# Patient Record
Sex: Male | Born: 1960 | Race: White | Hispanic: No | Marital: Married | Smoking: Former smoker
Health system: Southern US, Community
[De-identification: ages and names within clinical notes are randomized; demographics above are authoritative.]

## PROBLEM LIST (undated history)

## (undated) HISTORY — PX: OTHER SURGICAL HISTORY: SHX169

---

## 2007-11-04 ENCOUNTER — Emergency Department (HOSPITAL_COMMUNITY): Admission: EM | Admit: 2007-11-04 | Discharge: 2007-11-04 | Payer: Self-pay | Admitting: Emergency Medicine

## 2009-02-01 ENCOUNTER — Emergency Department (HOSPITAL_COMMUNITY): Admission: EM | Admit: 2009-02-01 | Discharge: 2009-02-01 | Payer: Self-pay | Admitting: Emergency Medicine

## 2009-04-09 ENCOUNTER — Emergency Department (HOSPITAL_COMMUNITY): Admission: EM | Admit: 2009-04-09 | Discharge: 2009-04-10 | Payer: Self-pay | Admitting: Emergency Medicine

## 2009-07-22 ENCOUNTER — Emergency Department (HOSPITAL_COMMUNITY): Admission: EM | Admit: 2009-07-22 | Discharge: 2009-07-23 | Payer: Self-pay | Admitting: Emergency Medicine

## 2011-01-26 LAB — BASIC METABOLIC PANEL
BUN: 12
CO2: 24
Calcium: 9.1
Chloride: 108
Creatinine, Ser: 1.29
GFR calc Af Amer: >60
Glucose, Bld: 109 — ABNORMAL HIGH
Sodium: 140

## 2011-01-26 LAB — DIFFERENTIAL
Basophils Absolute: 0
Basophils Relative: 0
Eosinophils Absolute: 0.1
Eosinophils Relative: 1
Monocytes Absolute: 0.7
Monocytes Relative: 7
Neutro Abs: 7.2
Neutrophils Relative %: 70

## 2011-01-26 LAB — MAGNESIUM: Magnesium: 2.2

## 2011-01-26 LAB — CBC
MCHC: 34.7
MCV: 89.1
RDW: 12.6
WBC: 10.4

## 2014-07-20 ENCOUNTER — Emergency Department (HOSPITAL_COMMUNITY): Payer: 59

## 2014-07-20 ENCOUNTER — Emergency Department (HOSPITAL_COMMUNITY)
Admission: EM | Admit: 2014-07-20 | Discharge: 2014-07-20 | Disposition: A | Discharge status: Home / Self Care | Payer: 59 | Attending: Emergency Medicine | Admitting: Emergency Medicine

## 2014-07-20 ENCOUNTER — Encounter (HOSPITAL_COMMUNITY): Payer: Self-pay | Admitting: Emergency Medicine

## 2014-07-20 DIAGNOSIS — M549 Dorsalgia, unspecified: Secondary | ICD-10-CM

## 2014-07-20 DIAGNOSIS — Z791 Long term (current) use of non-steroidal anti-inflammatories (NSAID): Secondary | ICD-10-CM | POA: Diagnosis not present

## 2014-07-20 DIAGNOSIS — Y9389 Activity, other specified: Secondary | ICD-10-CM | POA: Insufficient documentation

## 2014-07-20 DIAGNOSIS — S0990XA Unspecified injury of head, initial encounter: Secondary | ICD-10-CM | POA: Diagnosis not present

## 2014-07-20 DIAGNOSIS — Z79899 Other long term (current) drug therapy: Secondary | ICD-10-CM | POA: Insufficient documentation

## 2014-07-20 DIAGNOSIS — M542 Cervicalgia: Secondary | ICD-10-CM

## 2014-07-20 DIAGNOSIS — Y998 Other external cause status: Secondary | ICD-10-CM | POA: Insufficient documentation

## 2014-07-20 DIAGNOSIS — S199XXA Unspecified injury of neck, initial encounter: Secondary | ICD-10-CM | POA: Diagnosis present

## 2014-07-20 DIAGNOSIS — Z87891 Personal history of nicotine dependence: Secondary | ICD-10-CM | POA: Insufficient documentation

## 2014-07-20 DIAGNOSIS — Y92410 Unspecified street and highway as the place of occurrence of the external cause: Secondary | ICD-10-CM | POA: Insufficient documentation

## 2014-07-20 DIAGNOSIS — S3992XA Unspecified injury of lower back, initial encounter: Secondary | ICD-10-CM | POA: Diagnosis not present

## 2014-07-20 MED ORDER — CYCLOBENZAPRINE HCL 10 MG PO TABS: 10.0000 mg | ORAL_TABLET | Freq: Two times a day (BID) | ORAL | Status: AC | PRN

## 2014-07-20 MED ORDER — NAPROXEN 500 MG PO TABS: 500.0000 mg | ORAL_TABLET | Freq: Two times a day (BID) | ORAL | Status: AC

## 2014-07-20 MED ORDER — HYDROMORPHONE HCL 1 MG/ML IJ SOLN
1.0000 mg | Freq: Once | INTRAMUSCULAR | Status: AC
  Administered 2014-07-20: 1 mg via INTRAVENOUS
  Filled 2014-07-20: qty 1
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
## 2017-02-21 ENCOUNTER — Ambulatory Visit: Payer: 59 | Admitting: Audiology

## 2017-05-24 ENCOUNTER — Telehealth: Payer: Self-pay | Admitting: Audiology

## 2017-05-24 NOTE — Telephone Encounter (Signed)
[facility] does not provide hearing aids. If that is primary reason for visit, follow-up with referent for a hearing aid evaluation and audiogram is recommended. Deon Duer L. Tyriana Helmkamp, Au.D., CCC-A Doctor of Audiology 05/24/2017  

## 2017-05-28 ENCOUNTER — Ambulatory Visit: Payer: 59 | Admitting: Audiology

## 2019-10-10 ENCOUNTER — Other Ambulatory Visit: Payer: Self-pay | Admitting: Physician Assistant

## 2019-10-10 DIAGNOSIS — F17200 Nicotine dependence, unspecified, uncomplicated: Secondary | ICD-10-CM

## 2019-10-27 ENCOUNTER — Ambulatory Visit: Payer: 59

## 2019-11-06 ENCOUNTER — Other Ambulatory Visit: Payer: Self-pay | Admitting: Physician Assistant

## 2019-11-07 ENCOUNTER — Ambulatory Visit
Admission: RE | Admit: 2019-11-07 | Discharge: 2019-11-07 | Disposition: A | Discharge status: Home / Self Care | Payer: No Typology Code available for payment source | Source: Ambulatory Visit | Attending: Physician Assistant | Admitting: Physician Assistant

## 2019-11-07 DIAGNOSIS — F17200 Nicotine dependence, unspecified, uncomplicated: Secondary | ICD-10-CM

## 2019-11-13 ENCOUNTER — Telehealth: Payer: Self-pay

## 2019-11-13 NOTE — Telephone Encounter (Signed)
NOTES ON FILE FROM [facility] [phone], SENT REFERRAL TO SCHEDULING °

## 2020-01-23 ENCOUNTER — Encounter: Payer: Self-pay | Admitting: Cardiovascular Disease

## 2020-01-23 ENCOUNTER — Other Ambulatory Visit: Payer: Self-pay

## 2020-01-23 ENCOUNTER — Ambulatory Visit (INDEPENDENT_AMBULATORY_CARE_PROVIDER_SITE_OTHER): Payer: No Typology Code available for payment source | Admitting: Cardiovascular Disease

## 2020-01-23 VITALS — BP 122/74 | HR 48 | Temp 97.0°F | Ht 71.0 in | Wt 172.0 lb

## 2020-01-23 DIAGNOSIS — Z01812 Encounter for preprocedural laboratory examination: Secondary | ICD-10-CM

## 2020-01-23 DIAGNOSIS — I2584 Coronary atherosclerosis due to calcified coronary lesion: Secondary | ICD-10-CM

## 2020-01-23 DIAGNOSIS — I2510 Atherosclerotic heart disease of native coronary artery without angina pectoris: Secondary | ICD-10-CM

## 2020-01-23 DIAGNOSIS — R0600 Dyspnea, unspecified: Secondary | ICD-10-CM

## 2020-01-23 DIAGNOSIS — R0609 Other forms of dyspnea: Secondary | ICD-10-CM | POA: Insufficient documentation

## 2020-01-23 DIAGNOSIS — Z720 Tobacco use: Secondary | ICD-10-CM | POA: Insufficient documentation

## 2020-01-23 DIAGNOSIS — R5383 Other fatigue: Secondary | ICD-10-CM | POA: Diagnosis not present

## 2020-01-23 DIAGNOSIS — I359 Nonrheumatic aortic valve disorder, unspecified: Secondary | ICD-10-CM

## 2020-01-23 DIAGNOSIS — I10 Essential (primary) hypertension: Secondary | ICD-10-CM | POA: Diagnosis not present

## 2020-01-23 DIAGNOSIS — I739 Peripheral vascular disease, unspecified: Secondary | ICD-10-CM | POA: Diagnosis not present

## 2020-01-23 HISTORY — DX: Atherosclerotic heart disease of native coronary artery without angina pectoris: I25.10

## 2020-01-23 HISTORY — DX: Nonrheumatic aortic valve disorder, unspecified: I35.9

## 2020-01-23 HISTORY — DX: Tobacco use: Z72.0

## 2020-01-23 HISTORY — DX: Dyspnea, unspecified: R06.00

## 2020-01-23 HISTORY — DX: Other forms of dyspnea: R06.09

## 2020-01-23 HISTORY — DX: Essential (primary) hypertension: I10
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
## 2020-01-23 NOTE — Patient Instructions (Signed)
Medication Instructions:  Your physician recommends that you continue on your current medications as directed. Please refer to the Current Medication list given to you today.  *If you need a refill on your cardiac medications before your next appointment, please call your pharmacy*  Lab Work: BMET 1 WEEK PRIOR TO CT   If you have labs (blood work) drawn today and your tests are completely normal, you will receive your results only by: Marland Kitchen MyChart Message (if you have MyChart) OR . A paper copy in the mail If you have any lab test that is abnormal or we need to change your treatment, we will call you to review the results.   Testing/Procedures: Your physician has requested that you have cardiac CT. Cardiac computed tomography (CT) is a painless test that uses an x-ray machine to take clear, detailed pictures of your heart. For further information please visit [email or web address]. Please follow instruction sheet as given. ONCE INSURANCE HAS APPROVED THE OFFICE WILL CALL YOU TO SCHEDULE. IF YOU DO NOT HEAR FROM THE OFFICE IN 2 WEEKS CALL TO FOLLOW UP   Your physician has requested that you have an ankle brachial index (ABI). During this test an ultrasound and blood pressure cuff are used to evaluate the arteries that supply the arms and legs with blood. Allow thirty minutes for this exam. There are no restrictions or special instructions.  Follow-Up: At [facility], you and your health needs are our priority.  As part of our continuing mission to provide you with exceptional heart care, we have created designated Provider Care Teams.  These Care Teams include your primary Cardiologist (physician) and Advanced Practice Providers (APPs -  Physician Assistants and Nurse Practitioners) who all work together to provide you with the care you need, when you need it.  We recommend signing up for the patient portal called "MyChart".  Sign up information is provided on this After Visit Summary.  MyChart is  used to connect with patients for Virtual Visits (Telemedicine).  Patients are able to view lab/test results, encounter notes, upcoming appointments, etc.  Non-urgent messages can be sent to your provider as well.   To learn more about what you can do with MyChart, go to [email or web address].    Your next appointment:   2 month(s)  The format for your next appointment:   In Person  Provider:   You may see DR [facility]  or one of the following Advanced Practice Providers on your designated Care Team:    Kerin Ransom, PA-C  [place], [place]  Coletta Memos, [place]  Other Instructions  Your cardiac CT will be scheduled at one of the below locations:   [facility] [address] [place], [place] [address] [phone]  [place] [address] [place], [place] [address] [phone]  If scheduled at [facility], please arrive at the [facility] main entrance of [facility] 30 minutes prior to test start time. Proceed to the [facility] Radiology Department (first floor) to check-in and test prep.  If scheduled at [facility], please arrive 15 mins early for check-in and test prep.  Please follow these instructions carefully (unless otherwise directed):  Hold all erectile dysfunction medications at least 3 days (72 hrs) prior to test.  On the Night Before the Test: . Be sure to Drink plenty of water. . Do not consume any caffeinated/decaffeinated beverages or chocolate 12 hours prior to your test. .  Do not take any antihistamines 12 hours prior to your test. . If the patient has contrast allergy: ? Patient will need a prescription for Prednisone and very clear instructions (as follows): 1. Prednisone 50 mg - take 13 hours prior to test 2. Take another Prednisone 50 mg 7 hours prior to test 3. Take another Prednisone 50 mg 1 hour prior to test 4. Take Benadryl 50  mg 1 hour prior to test . Patient must complete all four doses of above prophylactic medications. . Patient will need a ride after test due to Benadryl.  On the Day of the Test: . Drink plenty of water. Do not drink any water within one hour of the test. . Do not eat any food 4 hours prior to the test. . You may take your regular medications prior to the test.  . Take metoprolol (Lopressor) two hours prior to test. . HOLD Furosemide/Hydrochlorothiazide morning of the test. . FEMALES- please wear underwire-free bra if available      After the Test: . Drink plenty of water. . After receiving IV contrast, you may experience a mild flushed feeling. This is normal. . On occasion, you may experience a mild rash up to 24 hours after the test. This is not dangerous. If this occurs, you can take Benadryl 25 mg and increase your fluid intake. . If you experience trouble breathing, this can be serious. If it is severe call 911 IMMEDIATELY. If it is mild, please call our office. . If you take any of these medications: Glipizide/Metformin, Avandament, Glucavance, please do not take 48 hours after completing test unless otherwise instructed.   Once we have confirmed authorization from your insurance company, we will call you to set up a date and time for your test. Based on how quickly your insurance processes prior authorizations requests, please allow up to 4 weeks to be contacted for scheduling your Cardiac CT appointment. Be advised that routine Cardiac CT appointments could be scheduled as many as 8 weeks after your provider has ordered it.  For non-scheduling related questions, please contact the cardiac imaging nurse navigator should you have any questions/concerns: Marchia Bond, Cardiac Imaging Nurse Navigator Burley Saver, Interim Cardiac Imaging Nurse [place] [facility] Direct Office Dial: [phone]   For scheduling needs, including cancellations and  rescheduling, please call Vivien Rota at [phone], option [phone].      Cardiac CT Angiogram A cardiac CT angiogram is a procedure to look at the heart and the area around the heart. It may be done to help find the cause of chest pains or other symptoms of heart disease. During this procedure, a substance called contrast dye is injected into the blood vessels in the area to be checked. A large X-ray machine, called a CT scanner, then takes detailed pictures of the heart and the surrounding area. The procedure is also sometimes called a coronary CT angiogram, coronary artery scanning, or CTA. A cardiac CT angiogram allows the health care provider to see how well blood is flowing to and from the heart. The health care provider will be able to see if there are any problems, such as:  Blockage or narrowing of the coronary arteries in the heart.  Fluid around the heart.  Signs of weakness or disease in the muscles, valves, and tissues of the heart. Tell a health care provider about:  Any allergies you have. This is especially important if you have had a previous allergic reaction to contrast dye.  All medicines you are taking, including vitamins, herbs, eye drops, creams, and over-the-counter medicines.  Any blood disorders you have.  Any surgeries you have had.  Any medical conditions you have.  Whether you are pregnant or may be pregnant.  Any anxiety disorders, chronic pain, or other conditions you have that may increase your stress or prevent you from lying still. What are the risks? Generally, this is a safe procedure. However, problems may occur, including:  Bleeding.  Infection.  Allergic reactions to medicines or dyes.  Damage to other structures or organs.  Kidney damage from the contrast dye that is used.  Increased risk of cancer from radiation exposure. This risk is low. Talk with your health care provider about: ? The risks and benefits of testing. ? How you can receive the  lowest dose of radiation. What happens before the procedure?  Wear comfortable clothing and remove any jewelry, glasses, dentures, and hearing aids.  Follow instructions from your health care provider about eating and drinking. This may include: ? For 12 hours before the procedure -- avoid caffeine. This includes tea, coffee, soda, energy drinks, and diet pills. Drink plenty of water or other fluids that do not have caffeine in them. Being well hydrated can prevent complications. ? For 4-6 hours before the procedure -- stop eating and drinking. The contrast dye can cause nausea, but this is less likely if your stomach is empty.  Ask your health care provider about changing or stopping your regular medicines. This is especially important if you are taking diabetes medicines, blood thinners, or medicines to treat problems with erections (erectile dysfunction). What happens during the procedure?   Hair on your chest may need to be removed so that small sticky patches called electrodes can be placed on your chest. These will transmit information that helps to monitor your heart during the procedure.  An IV will be inserted into one of your veins.  You might be given a medicine to control your heart rate during the procedure. This will help to ensure that good images are obtained.  You will be asked to lie on an exam table. This table will slide in and out of the CT machine during the procedure.  Contrast dye will be injected into the IV. You might feel warm, or you may get a metallic taste in your mouth.  You will be given a medicine called nitroglycerin. This will relax or dilate the arteries in your heart.  The table that you are lying on will move into the CT machine tunnel for the scan.  The person running the machine will give you instructions while the scans are being done. You may be asked to: ? Keep your arms above your head. ? Hold your breath. ? Stay very still, even if the table  is moving.  When the scanning is complete, you will be moved out of the machine.  The IV will be removed. The procedure may vary among health care providers and hospitals. What can I expect after the procedure? After your procedure, it is common to have:  A metallic taste in your mouth from the contrast dye.  A feeling of warmth.  A headache from the nitroglycerin. Follow these instructions at home:  Take over-the-counter and prescription medicines only as told by your health care provider.  If you are told, drink enough fluid to keep your urine pale yellow. This will help to flush the contrast dye out of your body.  Most people can  return to their normal activities right after the procedure. Ask your health care provider what activities are safe for you.  It is up to you to get the results of your procedure. Ask your health care provider, or the department that is doing the procedure, when your results will be ready.  Keep all follow-up visits as told by your health care provider. This is important. Contact a health care provider if:  You have any symptoms of allergy to the contrast dye. These include: ? Shortness of breath. ? Rash or hives. ? A racing heartbeat. Summary  A cardiac CT angiogram is a procedure to look at the heart and the area around the heart. It may be done to help find the cause of chest pains or other symptoms of heart disease.  During this procedure, a large X-ray machine, called a CT scanner, takes detailed pictures of the heart and the surrounding area after a contrast dye has been injected into blood vessels in the area.  Ask your health care provider about changing or stopping your regular medicines before the procedure. This is especially important if you are taking diabetes medicines, blood thinners, or medicines to treat erectile dysfunction.  If you are told, drink enough fluid to keep your urine pale yellow. This will help to flush the contrast  dye out of your body. This information is not intended to replace advice given to you by your health care provider. Make sure you discuss any questions you have with your health care provider. Document Revised: 12/11/2018 Document Reviewed: 12/11/2018 Elsevier Patient Education  [employer].

## 2020-02-05 LAB — BASIC METABOLIC PANEL
BUN/Creatinine Ratio: 11 (ref 9–20)
BUN: 15 mg/dL (ref 6–24)
CO2: 27 mmol/L (ref 20–29)
Calcium: 9.3 mg/dL (ref 8.7–10.2)
Chloride: 98 mmol/L (ref 96–106)
Creatinine, Ser: 1.38 mg/dL — ABNORMAL HIGH (ref 0.76–1.27)
GFR calc Af Amer: 65 mL/min/{1.73_m2} (ref >59)
GFR calc non Af Amer: 56 mL/min/{1.73_m2} — ABNORMAL LOW (ref >59)
Glucose: 83 mg/dL (ref 65–99)
Potassium: 4.8 mmol/L (ref 3.5–5.2)
Sodium: 136 mmol/L (ref 134–144)

## 2020-02-06 ENCOUNTER — Telehealth (HOSPITAL_COMMUNITY): Payer: Self-pay | Admitting: Emergency Medicine

## 2020-02-06 NOTE — Telephone Encounter (Signed)
Reaching out to patient to offer assistance regarding upcoming cardiac imaging study; pt verbalizes understanding of appt date/time, parking situation and where to check in, pre-test NPO status and medications ordered, and verified current allergies; name and call back number provided for further questions should they arise Sulo Janczak RN Navigator Cardiac Imaging [facility] [phone] office [phone] cell 

## 2020-02-09 ENCOUNTER — Ambulatory Visit (HOSPITAL_COMMUNITY)
Admission: RE | Admit: 2020-02-09 | Discharge: 2020-02-09 | Disposition: A | Discharge status: Home / Self Care | Payer: No Typology Code available for payment source | Source: Ambulatory Visit | Attending: Cardiovascular Disease | Admitting: Cardiovascular Disease

## 2020-02-09 ENCOUNTER — Other Ambulatory Visit: Payer: Self-pay

## 2020-02-09 DIAGNOSIS — I10 Essential (primary) hypertension: Secondary | ICD-10-CM | POA: Diagnosis present

## 2020-02-09 DIAGNOSIS — R0600 Dyspnea, unspecified: Secondary | ICD-10-CM | POA: Diagnosis not present

## 2020-02-09 DIAGNOSIS — R5383 Other fatigue: Secondary | ICD-10-CM | POA: Insufficient documentation

## 2020-02-09 DIAGNOSIS — R0609 Other forms of dyspnea: Secondary | ICD-10-CM

## 2020-02-09 DIAGNOSIS — I2510 Atherosclerotic heart disease of native coronary artery without angina pectoris: Secondary | ICD-10-CM

## 2020-02-09 MED ORDER — NITROGLYCERIN 0.4 MG SL SUBL
SUBLINGUAL_TABLET | SUBLINGUAL | Status: AC
  Filled 2020-02-09: qty 2

## 2020-02-09 MED ORDER — NITROGLYCERIN 0.4 MG SL SUBL
0.8000 mg | SUBLINGUAL_TABLET | Freq: Once | SUBLINGUAL | Status: AC
  Administered 2020-02-09: 0.8 mg via SUBLINGUAL

## 2020-02-09 MED ORDER — IOHEXOL 350 MG/ML SOLN
80.0000 mL | Freq: Once | INTRAVENOUS | Status: AC | PRN
  Administered 2020-02-09: 80 mL via INTRAVENOUS

## 2020-02-10 ENCOUNTER — Ambulatory Visit (HOSPITAL_COMMUNITY)
Admission: RE | Admit: 2020-02-10 | Discharge: 2020-02-10 | Disposition: A | Discharge status: Home / Self Care | Payer: No Typology Code available for payment source | Source: Ambulatory Visit | Attending: Cardiovascular Disease | Admitting: Cardiovascular Disease

## 2020-02-10 DIAGNOSIS — I10 Essential (primary) hypertension: Secondary | ICD-10-CM | POA: Insufficient documentation

## 2020-02-10 DIAGNOSIS — R0600 Dyspnea, unspecified: Secondary | ICD-10-CM | POA: Diagnosis not present

## 2020-02-10 DIAGNOSIS — R5383 Other fatigue: Secondary | ICD-10-CM | POA: Diagnosis present

## 2020-02-10 DIAGNOSIS — R0609 Other forms of dyspnea: Secondary | ICD-10-CM

## 2020-02-12 DIAGNOSIS — I2510 Atherosclerotic heart disease of native coronary artery without angina pectoris: Secondary | ICD-10-CM | POA: Diagnosis not present

## 2020-02-13 ENCOUNTER — Telehealth: Payer: Self-pay | Admitting: Cardiovascular Disease

## 2020-02-13 DIAGNOSIS — I359 Nonrheumatic aortic valve disorder, unspecified: Secondary | ICD-10-CM

## 2020-02-13 DIAGNOSIS — I2510 Atherosclerotic heart disease of native coronary artery without angina pectoris: Secondary | ICD-10-CM

## 2020-02-13 NOTE — Telephone Encounter (Signed)
Advised patient, verbalized understanding. Scheduled echo and labs same day

## 2020-02-13 NOTE — Telephone Encounter (Signed)
Patient returning call for lab results. 

## 2020-02-13 NOTE — Telephone Encounter (Signed)
Patient called in to office to obtain lab results. Patient notified of the following.    Chilton Si, MD  02/12/2020 3:25 PM EDT     Kidney function mildly abnormal. Make sure to stay hydrated and limit use of naproxen and ibuprofen.    Patient made aware and verbalized understanding.

## 2020-02-13 NOTE — Telephone Encounter (Signed)
-----   Message from Chilton Si, MD sent at 02/12/2020  3:27 PM EDT ----- CT showed mild to moderate blockages that aren't significant enough to cause symptoms but we need to be aggressive about risk factor modification to prevent it from getting worse.  We need to check fasting lipids.  Also recommend getting an echocardiogram because the aortic valve is calcified and may be abnormal.  An echo will help [place] to see this better and determine the significance.  Please make sure he has follow up after the echo to discuss further.

## 2020-02-17 ENCOUNTER — Ambulatory Visit (HOSPITAL_COMMUNITY)
Admission: RE | Admit: 2020-02-17 | Discharge: 2020-02-17 | Disposition: A | Discharge status: Home / Self Care | Payer: No Typology Code available for payment source | Source: Ambulatory Visit | Attending: Cardiovascular Disease | Admitting: Cardiovascular Disease

## 2020-02-17 ENCOUNTER — Other Ambulatory Visit: Payer: Self-pay

## 2020-02-17 DIAGNOSIS — I739 Peripheral vascular disease, unspecified: Secondary | ICD-10-CM | POA: Diagnosis not present

## 2020-02-26 ENCOUNTER — Ambulatory Visit (HOSPITAL_COMMUNITY): Payer: No Typology Code available for payment source | Attending: Cardiovascular Disease

## 2020-02-26 ENCOUNTER — Other Ambulatory Visit: Payer: No Typology Code available for payment source | Admitting: *Deleted

## 2020-02-26 ENCOUNTER — Other Ambulatory Visit: Payer: Self-pay

## 2020-02-26 DIAGNOSIS — I359 Nonrheumatic aortic valve disorder, unspecified: Secondary | ICD-10-CM | POA: Diagnosis present

## 2020-02-26 DIAGNOSIS — I2510 Atherosclerotic heart disease of native coronary artery without angina pectoris: Secondary | ICD-10-CM

## 2020-02-26 LAB — ECHOCARDIOGRAM COMPLETE
AR max vel: 1.8 cm2
AV Area VTI: 1.62 cm2
AV Area mean vel: 1.83 cm2
AV Mean grad: 9 mmHg
AV Peak grad: 18.5 mmHg
Ao pk vel: 2.15 m/s
Area-P 1/2: 3.12 cm2
P 1/2 time: 497 msec
S' Lateral: 3.1 cm

## 2020-02-26 LAB — LIPID PANEL
Chol/HDL Ratio: 3.5 ratio (ref 0.0–5.0)
Cholesterol, Total: 166 mg/dL (ref 100–199)
HDL: 47 mg/dL (ref >39)
LDL Chol Calc (NIH): 97 mg/dL (ref 0–99)
Triglycerides: 122 mg/dL (ref 0–149)
VLDL Cholesterol Cal: 22 mg/dL (ref 5–40)
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
## 2020-03-11 ENCOUNTER — Ambulatory Visit (INDEPENDENT_AMBULATORY_CARE_PROVIDER_SITE_OTHER): Payer: No Typology Code available for payment source | Admitting: General Practice

## 2020-03-11 ENCOUNTER — Encounter: Payer: Self-pay | Admitting: General Practice

## 2020-03-11 ENCOUNTER — Other Ambulatory Visit: Payer: Self-pay

## 2020-03-11 VITALS — BP 134/78 | HR 58 | Ht 72.0 in | Wt 181.6 lb

## 2020-03-11 DIAGNOSIS — E782 Mixed hyperlipidemia: Secondary | ICD-10-CM

## 2020-03-11 DIAGNOSIS — R001 Bradycardia, unspecified: Secondary | ICD-10-CM

## 2020-03-11 DIAGNOSIS — Z79899 Other long term (current) drug therapy: Secondary | ICD-10-CM

## 2020-03-11 DIAGNOSIS — I2510 Atherosclerotic heart disease of native coronary artery without angina pectoris: Secondary | ICD-10-CM | POA: Diagnosis not present

## 2020-03-11 DIAGNOSIS — R0609 Other forms of dyspnea: Secondary | ICD-10-CM

## 2020-03-11 DIAGNOSIS — Z720 Tobacco use: Secondary | ICD-10-CM

## 2020-03-11 DIAGNOSIS — R0600 Dyspnea, unspecified: Secondary | ICD-10-CM

## 2020-03-11 NOTE — Patient Instructions (Addendum)
Medication Instructions:  The current medical regimen is effective;  continue present plan and medications as directed. Please refer to the Current Medication list given to you today. *If you need a refill on your cardiac medications before your next appointment, please call your pharmacy*  Lab Work: FASTING LIPID THE WEEK BEFORE FOLLOW UP APPOINTMENT If you have labs (blood work) drawn today and your tests are completely normal, you will receive your results only by:  MyChart Message (if you have MyChart) OR A paper copy in the mail.  If you have any lab test that is abnormal or we need to change your treatment, we will call you to review the results. You may go to any [facility] that is convenient for you however, we do have a lab in our office that is able to assist you. You DO NOT need an appointment for our lab. The lab is open 8:00am and closes at 4:00pm. Lunch 12:45 - 1:45pm.  Special Instructions PLEASE READ AND FOLLOW INCREASE FIBER DIET  MAKE SURE TO READ LABELS FOR LOWER CHOLESTEROL/FAT  PLEASE INCREASE PHYSICAL ACTIVITY AS TOLERATED  Follow-Up: Your next appointment:  3 month(s) In Person with Chilton Siiffany Ringwood, MD OR IF UNAVAILABLE JESSE CLEAVER, FNP-C  At [facility], you and your health needs are our priority.  As part of our continuing mission to provide you with exceptional heart care, we have created designated Provider Care Teams.  These Care Teams include your primary Cardiologist (physician) and Advanced Practice Providers (APPs -  Physician Assistants and Nurse Practitioners) who all work together to provide you with the care you need, when you need it.  We recommend signing up for the patient portal called "MyChart".  Sign up information is provided on this After Visit Summary.  MyChart is used to connect with patients for Virtual Visits (Telemedicine).  Patients are able to view lab/test results, encounter notes, upcoming appointments, etc.  Non-urgent messages can be sent  to your provider as well.   To learn more about what you can do with MyChart, go to [email or web address].      High-Fiber Diet Fiber, also called dietary fiber, is a type of carbohydrate that is found in fruits, vegetables, whole grains, and beans. A high-fiber diet can have many health benefits. Your health care provider may recommend a high-fiber diet to help:  Prevent constipation. Fiber can make your bowel movements more regular.  Lower your cholesterol.  Relieve the following conditions: ? Swelling of veins in the anus (hemorrhoids). ? Swelling and irritation (inflammation) of specific areas of the digestive tract (uncomplicated diverticulosis). ? A problem of the large intestine (colon) that sometimes causes pain and diarrhea (irritable bowel syndrome, IBS).  Prevent overeating as part of a weight-loss plan.  Prevent heart disease, type 2 diabetes, and certain cancers. What is my plan? The recommended daily fiber intake in grams (g) includes:  38 g for men age 58 or younger.  30 g for men over age 58.  25 g for women age 650 or younger.  21 g for women over age 58. You can get the recommended daily intake of dietary fiber by:  Eating a variety of fruits, vegetables, grains, and beans.  Taking a fiber supplement, if it is not possible to get enough fiber through your diet. What do I need to know about a high-fiber diet?  It is better to get fiber through food sources rather than from fiber supplements. There is not a lot of research about how effective supplements  are.  Always check the fiber content on the nutrition facts label of any prepackaged food. Look for foods that contain 5 g of fiber or more per serving.  Talk with a diet and nutrition specialist (dietitian) if you have questions about specific foods that are recommended or not recommended for your medical condition, especially if those foods are not listed below.  Gradually increase how much fiber you  consume. If you increase your intake of dietary fiber too quickly, you may have bloating, cramping, or gas.  Drink plenty of water. Water helps you to digest fiber. What are tips for following this plan?  Eat a wide variety of high-fiber foods.  Make sure that half of the grains that you eat each day are whole grains.  Eat breads and cereals that are made with whole-grain flour instead of refined flour or white flour.  Eat Romano rice, bulgur wheat, or millet instead of white rice.  Start the day with a breakfast that is high in fiber, such as a cereal that contains 5 g of fiber or more per serving.  Use beans in place of meat in soups, salads, and pasta dishes.  Eat high-fiber snacks, such as berries, raw vegetables, nuts, and popcorn.  Choose whole fruits and vegetables instead of processed forms like juice or sauce. What foods can I eat?  Fruits Berries. Pears. Apples. Oranges. Avocado. Prunes and raisins. Dried figs. Vegetables Sweet potatoes. Spinach. Kale. Artichokes. Cabbage. Broccoli. Cauliflower. Green peas. Carrots. Squash. Grains Whole-grain breads. Multigrain cereal. Oats and oatmeal. Nole rice. Barley. Bulgur wheat. Millet. Quinoa. Bran muffins. Popcorn. Rye wafer crackers. Meats and other proteins Navy, kidney, and pinto beans. Soybeans. Split peas. Lentils. Nuts and seeds. Dairy Fiber-fortified yogurt. Beverages Fiber-fortified soy milk. Fiber-fortified orange juice. Other foods Fiber bars. The items listed above may not be a complete list of recommended foods and beverages. Contact a dietitian for more options. What foods are not recommended? Fruits Fruit juice. Cooked, strained fruit. Vegetables Fried potatoes. Canned vegetables. Well-cooked vegetables. Grains White bread. Pasta made with refined flour. White rice. Meats and other proteins Fatty cuts of meat. Fried chicken or fried fish. Dairy Milk. Yogurt. Cream cheese. Sour cream. Fats and  oils Butters. Beverages Soft drinks. Other foods Cakes and pastries. The items listed above may not be a complete list of foods and beverages to avoid. Contact a dietitian for more information. Summary  Fiber is a type of carbohydrate. It is found in fruits, vegetables, whole grains, and beans.  There are many health benefits of eating a high-fiber diet, such as preventing constipation, lowering blood cholesterol, helping with weight loss, and reducing your risk of heart disease, diabetes, and certain cancers.  Gradually increase your intake of fiber. Increasing too fast can result in cramping, bloating, and gas. Drink plenty of water while you increase your fiber.  The best sources of fiber include whole fruits and vegetables, whole grains, nuts, seeds, and beans. This information is not intended to replace advice given to you by your health care provider. Make sure you discuss any questions you have with your health care provider.  Steps to Quit Smoking Smoking tobacco is the leading cause of preventable death. It can affect almost every organ in the body. Smoking puts you and people around you at risk for many serious, long-lasting (chronic) diseases. Quitting smoking can be hard, but it is one of the best things that you can do for your health. It is never too late to quit. How  do I get ready to quit? When you decide to quit smoking, make a plan to help you succeed. Before you quit:  Pick a date to quit. Set a date within the next 2 weeks to give you time to prepare.  Write down the reasons why you are quitting. Keep this list in places where you will see it often.  Tell your family, friends, and co-workers that you are quitting. Their support is important.  Talk with your doctor about the choices that may help you quit.  Find out if your health insurance will pay for these treatments.  Know the people, places, things, and activities that make you want to smoke (triggers). Avoid  them. What first steps can I take to quit smoking?  Throw away all cigarettes at home, at work, and in your car.  Throw away the things that you use when you smoke, such as ashtrays and lighters.  Clean your car. Make sure to empty the ashtray.  Clean your home, including curtains and carpets. What can I do to help me quit smoking? Talk with your doctor about taking medicines and seeing a counselor at the same time. You are more likely to succeed when you do both.  If you are pregnant or breastfeeding, talk with your doctor about counseling or other ways to quit smoking. Do not take medicine to help you quit smoking unless your doctor tells you to do so. To quit smoking: Quit right away  Quit smoking totally, instead of slowly cutting back on how much you smoke over a period of time.  Go to counseling. You are more likely to quit if you go to counseling sessions regularly. Take medicine You may take medicines to help you quit. Some medicines need a prescription, and some you can buy over-the-counter. Some medicines may contain a drug called nicotine to replace the nicotine in cigarettes. Medicines may:  Help you to stop having the desire to smoke (cravings).  Help to stop the problems that come when you stop smoking (withdrawal symptoms). Your doctor may ask you to use:  Nicotine patches, gum, or lozenges.  Nicotine inhalers or sprays.  Non-nicotine medicine that is taken by mouth. Find resources Find resources and other ways to help you quit smoking and remain smoke-free after you quit. These resources are most helpful when you use them often. They include:  Online chats with a Veterinary surgeon.  Phone quitlines.  Printed Materials engineer.  Support groups or group counseling.  Text messaging programs.  Mobile phone apps. Use apps on your mobile phone or tablet that can help you stick to your quit plan. There are many free apps for mobile phones and tablets as well as  websites. Examples include Quit Guide from the [employer] and smokefree.gov  What things can I do to make it easier to quit?   Talk to your family and friends. Ask them to support and encourage you.  Call a phone quitline (1-800-QUIT-NOW), reach out to support groups, or work with a Veterinary surgeon.  Ask people who smoke to not smoke around you.  Avoid places that make you want to smoke, such as: ? Bars. ? Parties. ? Smoke-break areas at work.  Spend time with people who do not smoke.  Lower the stress in your life. Stress can make you want to smoke. Try these things to help your stress: ? Getting regular exercise. ? Doing deep-breathing exercises. ? Doing yoga. ? Meditating. ? Doing a body scan. To do this, close your  eyes, focus on one area of your body at a time from head to toe. Notice which parts of your body are tense. Try to relax the muscles in those areas. How will I feel when I quit smoking? Day 1 to 3 weeks Within the first 24 hours, you may start to have some problems that come from quitting tobacco. These problems are very bad 2-3 days after you quit, but they do not often last for more than 2-3 weeks. You may get these symptoms:  Mood swings.  Feeling restless, nervous, angry, or annoyed.  Trouble concentrating.  Dizziness.  Strong desire for high-sugar foods and nicotine.  Weight gain.  Trouble pooping (constipation).  Feeling like you may vomit (nausea).  Coughing or a sore throat.  Changes in how the medicines that you take for other issues work in your body.  Depression.  Trouble sleeping (insomnia). Week 3 and afterward After the first 2-3 weeks of quitting, you may start to notice more positive results, such as:  Better sense of smell and taste.  Less coughing and sore throat.  Slower heart rate.  Lower blood pressure.  Clearer skin.  Better breathing.  Fewer sick days. Quitting smoking can be hard. Do not give up if you fail the first time.  Some people need to try a few times before they succeed. Do your best to stick to your quit plan, and talk with your doctor if you have any questions or concerns. Summary  Smoking tobacco is the leading cause of preventable death. Quitting smoking can be hard, but it is one of the best things that you can do for your health.  When you decide to quit smoking, make a plan to help you succeed.  Quit smoking right away, not slowly over a period of time.  When you start quitting, seek help from your doctor, family, or friends. This information is not intended to replace advice given to you by your health care provider. Make sure you discuss any questions you have with your health care provider. Document Revised: 01/10/2019 Document Reviewed: 07/06/2018 Elsevier Patient Education  2020 [employer].

## 2020-03-22 ENCOUNTER — Telehealth: Payer: Self-pay | Admitting: *Deleted

## 2020-03-22 DIAGNOSIS — Q231 Congenital insufficiency of aortic valve: Secondary | ICD-10-CM

## 2020-03-22 NOTE — Telephone Encounter (Signed)
-----   Message from Chilton Si, MD sent at 03/22/2020  1:05 PM EST ----- Aortic valve is bicuspid.  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  The valve is only mildly stiff but will likely get worse with time.  We will repeat an echo in 1 year.  At this point it should not cause any problems but we need to follow it.  His heart is squeezing well and otherwise looks normal.

## 2020-03-22 NOTE — Telephone Encounter (Signed)
Left message to call back  

## 2020-04-09 NOTE — Addendum Note (Signed)
Addended by: Regis Bill B on: 04/09/2020 02:14 PM   Modules accepted: Orders

## 2020-04-09 NOTE — Telephone Encounter (Signed)
Patient never called back. Printed, highlighted Dr Leonides Sake comments, and mailed to patient
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
## 2020-06-11 ENCOUNTER — Ambulatory Visit: Payer: No Typology Code available for payment source | Admitting: General Practice

## 2021-01-20 ENCOUNTER — Telehealth (HOSPITAL_COMMUNITY): Payer: Self-pay | Admitting: Cardiovascular Disease

## 2021-01-20 NOTE — Telephone Encounter (Signed)
Forward to Dr Duke Salvia nurse -  to make aware

## 2021-01-20 NOTE — Telephone Encounter (Signed)
Just an FYI. We have made several attempts to contact this patient including sending a letter to schedule or reschedule their echocardiogram. We will be removing the patient from the echo WQ.  01/06/21 MAILED LETTER [facility]  01/04/21 [facility] to schedule @ 10:09/[facility]  12/30/20 [facility] to schedule @ 10:18/[facility]  12/07/20 [facility] to schedule in Oct @ 11:19/[facility]     Thank you

## 2021-04-08 ENCOUNTER — Other Ambulatory Visit: Payer: Self-pay | Admitting: Physician Assistant

## 2021-04-08 DIAGNOSIS — F17200 Nicotine dependence, unspecified, uncomplicated: Secondary | ICD-10-CM

## 2021-04-08 DIAGNOSIS — Z122 Encounter for screening for malignant neoplasm of respiratory organs: Secondary | ICD-10-CM

## 2021-05-03 ENCOUNTER — Inpatient Hospital Stay: Admission: RE | Admit: 2021-05-03 | Payer: No Typology Code available for payment source | Source: Ambulatory Visit

## 2021-05-24 ENCOUNTER — Ambulatory Visit
Admission: RE | Admit: 2021-05-24 | Discharge: 2021-05-24 | Disposition: A | Discharge status: Home / Self Care | Payer: No Typology Code available for payment source | Source: Ambulatory Visit | Attending: Physician Assistant | Admitting: Physician Assistant

## 2021-05-24 ENCOUNTER — Other Ambulatory Visit: Payer: Self-pay

## 2021-05-24 DIAGNOSIS — Z122 Encounter for screening for malignant neoplasm of respiratory organs: Secondary | ICD-10-CM

## 2021-05-24 DIAGNOSIS — F17200 Nicotine dependence, unspecified, uncomplicated: Secondary | ICD-10-CM

## 2021-07-01 ENCOUNTER — Other Ambulatory Visit: Payer: Self-pay | Admitting: Physician Assistant

## 2021-07-01 DIAGNOSIS — R911 Solitary pulmonary nodule: Secondary | ICD-10-CM

## 2021-08-26 ENCOUNTER — Ambulatory Visit
Admission: RE | Admit: 2021-08-26 | Discharge: 2021-08-26 | Disposition: A | Discharge status: Home / Self Care | Payer: No Typology Code available for payment source | Source: Ambulatory Visit | Attending: Physician Assistant | Admitting: Physician Assistant

## 2021-08-26 DIAGNOSIS — R911 Solitary pulmonary nodule: Secondary | ICD-10-CM

## 2022-05-04 IMAGING — CT CT CHEST LCS NODULE FOLLOW-UP W/O CM
1 of 2 series · 15 of 33 positions shown, 19 images · non-contrast
Comparison: 05/24/2021

CLINICAL DATA: One hundred thirty-six pack-year smoking history.
Current smoker.

EXAM:
CT CHEST WITHOUT CONTRAST FOR LUNG CANCER SCREENING NODULE FOLLOW-UP
TECHNIQUE: Multidetector CT imaging of the chest was performed following the
standard protocol without IV contrast.
RADIATION DOSE REDUCTION: This exam was performed according to the
departmental dose-optimization program which includes automated
exposure control, adjustment of the mA and/or kV according to
patient size and/or use of iterative reconstruction technique.

[Series 6: super d · axial · 0.72mm/px · z∈[-324,+9]mm · 15 of 457 slices shown, 19 images]
[im 20/457  mediastinal]
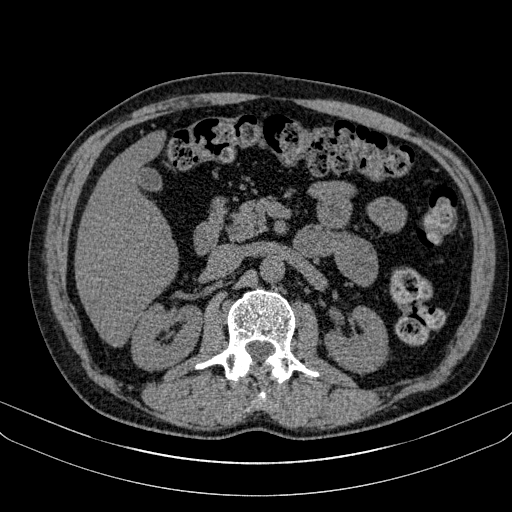
[im 20/457  lung]
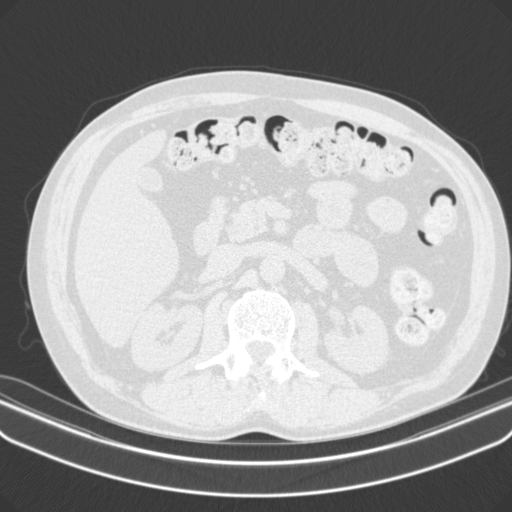
[im 60/457  lung]
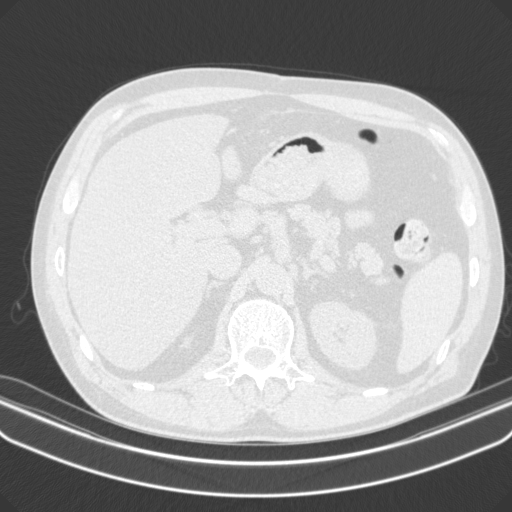
[im 100/457  lung]
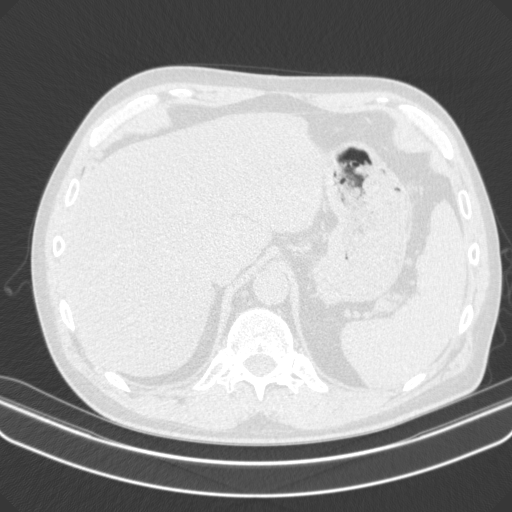
[im 119/457  lung]
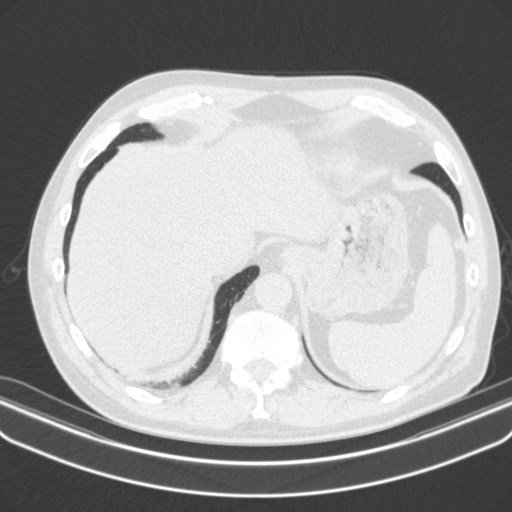
[im 139/457  mediastinal]
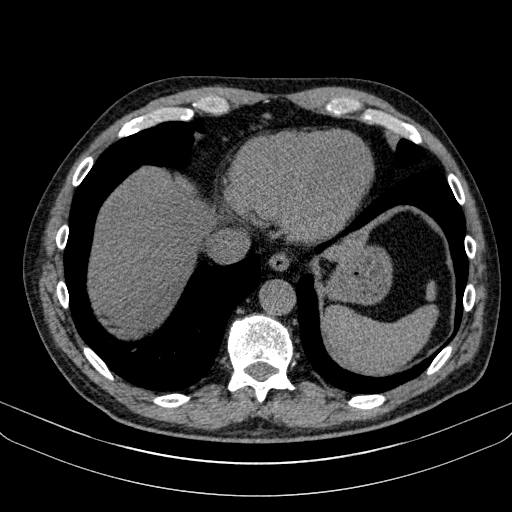
[im 139/457  lung]
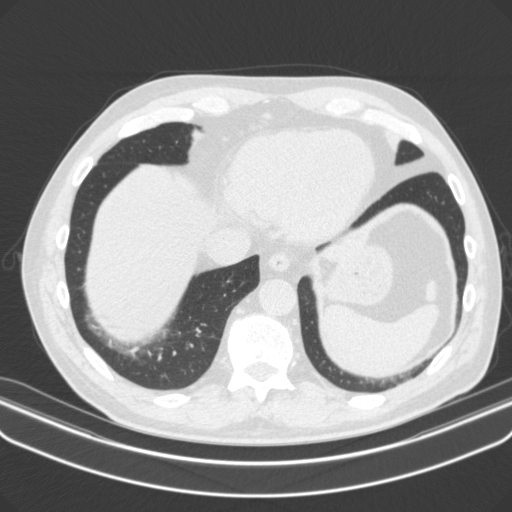
[im 179/457  lung]
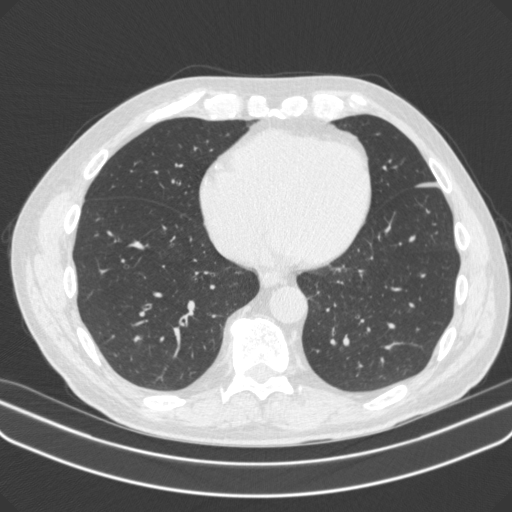
[im 217/457  lung]
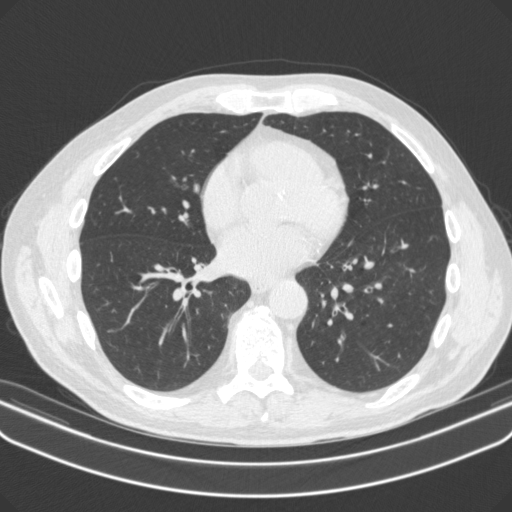
[im 229/457  lung]
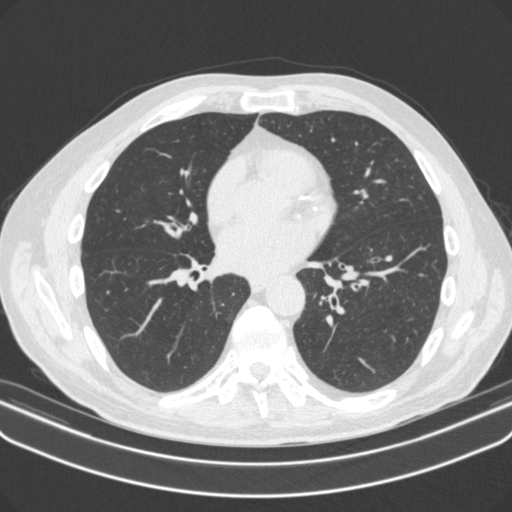
[im 238/457  mediastinal]
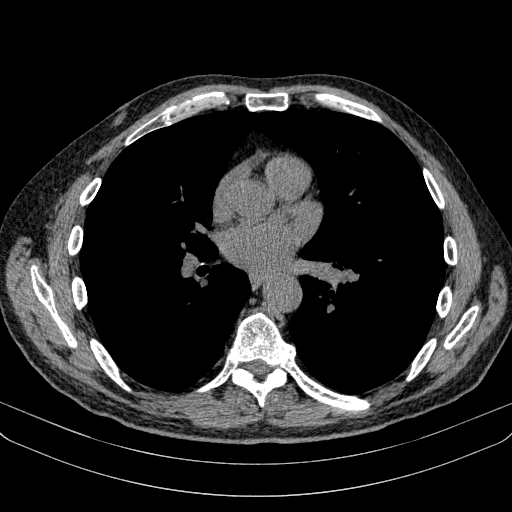
[im 238/457  lung]
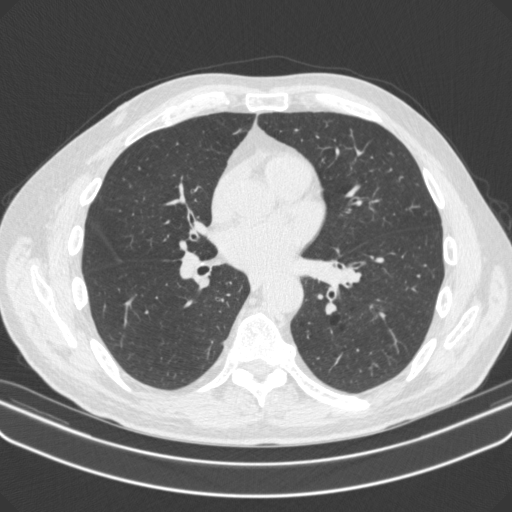
[im 278/457  lung]
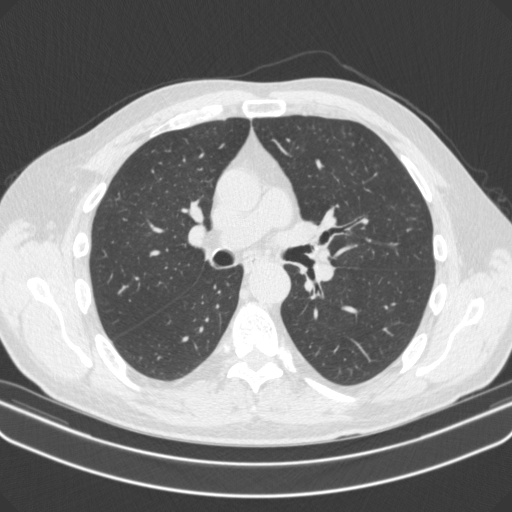
[im 318/457  lung]
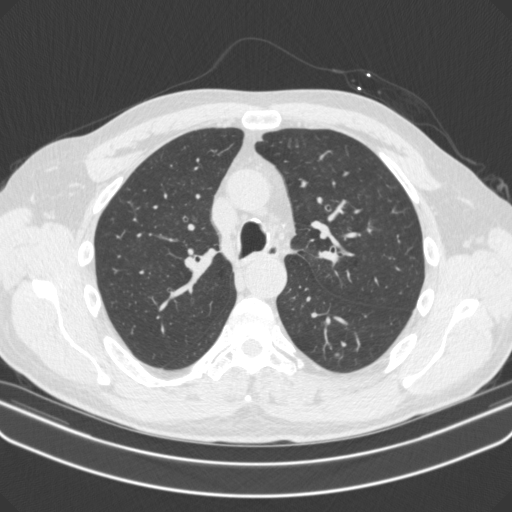
[im 338/457  lung]
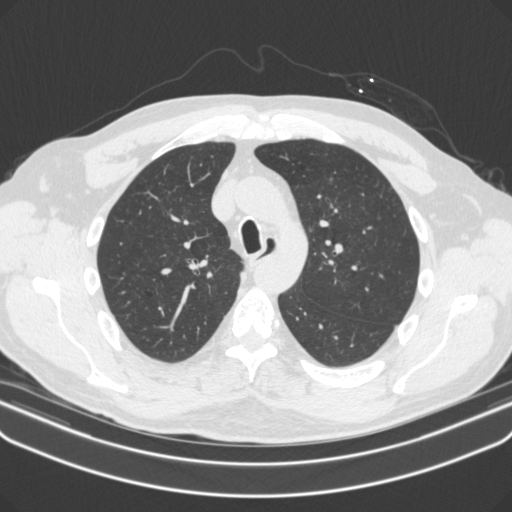
[im 357/457  mediastinal]
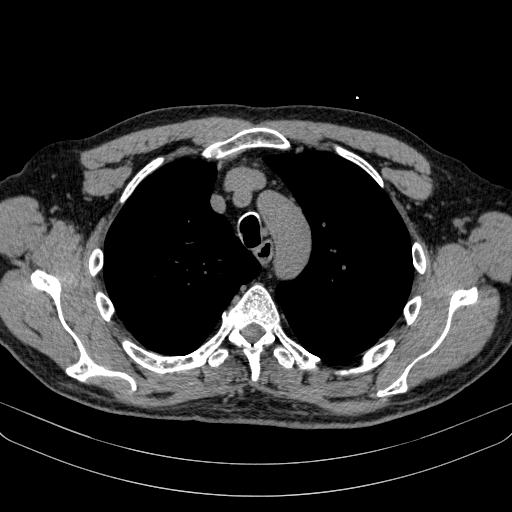
[im 357/457  lung]
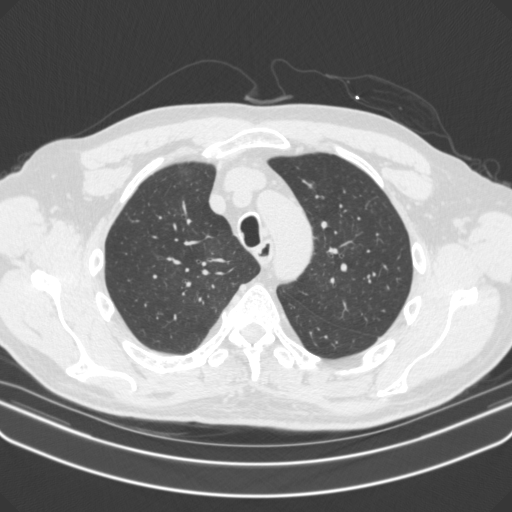
[im 397/457  lung]
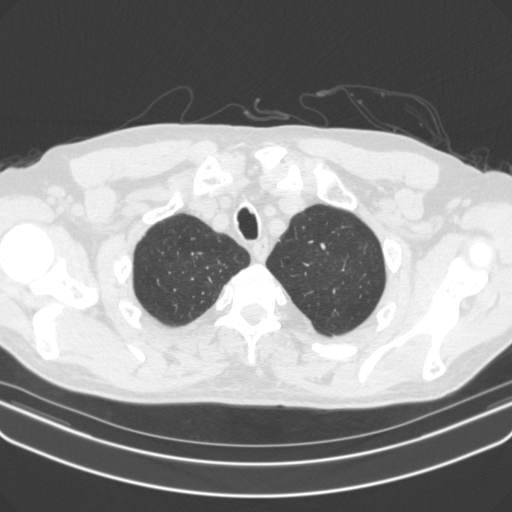
[im 437/457  lung]
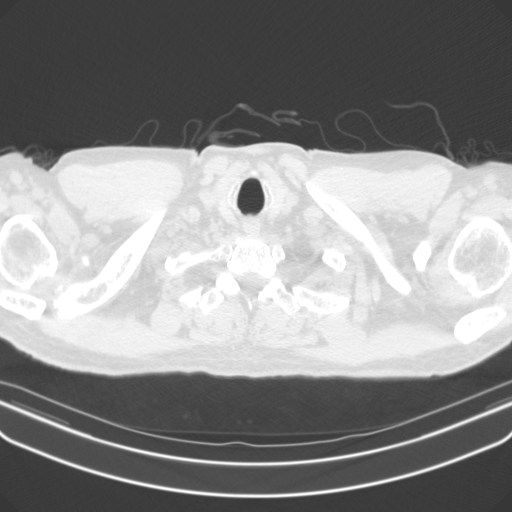

[15 of 33 positions shown; findings below may reference images not displayed]

FINDINGS: Cardiovascular: Aortic atherosclerosis. Tortuous thoracic aorta.
Normal heart size, without pericardial effusion. Three vessel
coronary artery calcification. Aortic valve calcification.

Mediastinum/Nodes: No mediastinal or definite hilar adenopathy,
given limitations of unenhanced CT.

Lungs/Pleura: No pleural fluid. Mild centrilobular emphysema. The
superior segment left lower lobe pulmonary nodule of interest is
similar at volume derived equivalent diameter 7.4 mm. Other
pulmonary nodules of maximally volume derived equivalent diameter
3.3 mm are not significantly changed.

Upper Abdomen: Moderate hepatic steatosis with sparing adjacent the
gallbladder. Normal imaged portions of the spleen, stomach,
pancreas, gallbladder, adrenal glands, kidneys.

Musculoskeletal: Mild superior endplate irregularity/wedging at T9,
T10, similar.
IMPRESSION: 1. Lung-RADS Category 2, benign appearance or behavior. Continue
annual screening with low-dose chest CT without contrast in 12
months.
2. Hepatic steatosis.
3. Aortic Atherosclerosis (O5QAJ-VTO.O) and Emphysema (O5QAJ-VB8.H).
Coronary artery atherosclerosis.
4. Aortic valvular calcifications. Consider echocardiography to
evaluate for valvular dysfunction.

## 2022-09-08 ENCOUNTER — Encounter: Payer: Self-pay | Admitting: Family Medicine
